# Patient Record
Sex: Male | Born: 1975 | Race: Black or African American | Hispanic: No | Marital: Married
Health system: Southern US, Community
[De-identification: ages and names within clinical notes are randomized; demographics above are authoritative.]

## PROBLEM LIST (undated history)

## (undated) DIAGNOSIS — I10 Essential (primary) hypertension: Secondary | ICD-10-CM

---

## 2017-06-04 ENCOUNTER — Other Ambulatory Visit: Payer: Self-pay

## 2017-06-04 ENCOUNTER — Encounter (HOSPITAL_COMMUNITY): Payer: Self-pay | Admitting: Emergency Medicine

## 2017-06-04 ENCOUNTER — Emergency Department (HOSPITAL_COMMUNITY)
Admission: EM | Admit: 2017-06-04 | Discharge: 2017-06-04 | Disposition: A | Discharge status: Home / Self Care | Payer: Self-pay | Attending: Emergency Medicine | Admitting: Emergency Medicine

## 2017-06-04 DIAGNOSIS — R300 Dysuria: Secondary | ICD-10-CM | POA: Insufficient documentation

## 2017-06-04 DIAGNOSIS — Z711 Person with feared health complaint in whom no diagnosis is made: Secondary | ICD-10-CM

## 2017-06-04 DIAGNOSIS — Z202 Contact with and (suspected) exposure to infections with a predominantly sexual mode of transmission: Secondary | ICD-10-CM | POA: Insufficient documentation

## 2017-06-04 DIAGNOSIS — I10 Essential (primary) hypertension: Secondary | ICD-10-CM | POA: Insufficient documentation

## 2017-06-04 HISTORY — DX: Essential (primary) hypertension: I10

## 2017-06-04 LAB — URINALYSIS, ROUTINE W REFLEX MICROSCOPIC
Bilirubin Urine: NEGATIVE
Glucose, UA: NEGATIVE mg/dL
Hgb urine dipstick: NEGATIVE
Ketones, ur: 20 mg/dL — AB
Leukocytes, UA: NEGATIVE
NITRITE: NEGATIVE
PH: 6 (ref 5.0–8.0)
Protein, ur: NEGATIVE mg/dL
SPECIFIC GRAVITY, URINE: 1.024 (ref 1.005–1.030)

## 2017-06-04 MED ORDER — LIDOCAINE HCL 1 % IJ SOLN
INTRAMUSCULAR | Status: AC
  Administered 2017-06-04: 0.9 mL
  Filled 2017-06-04: qty 20

## 2017-06-04 MED ORDER — METRONIDAZOLE 500 MG PO TABS
2000.0000 mg | ORAL_TABLET | Freq: Once | ORAL | Status: AC
  Administered 2017-06-04: 2000 mg via ORAL
  Filled 2017-06-04: qty 4

## 2017-06-04 MED ORDER — AZITHROMYCIN 250 MG PO TABS
1000.0000 mg | ORAL_TABLET | Freq: Once | ORAL | Status: AC
  Administered 2017-06-04: 1000 mg via ORAL
  Filled 2017-06-04: qty 4

## 2017-06-04 MED ORDER — CEFTRIAXONE SODIUM 250 MG IJ SOLR
250.0000 mg | Freq: Once | INTRAMUSCULAR | Status: AC
  Administered 2017-06-04: 250 mg via INTRAMUSCULAR
  Filled 2017-06-04: qty 250

## 2017-06-04 NOTE — ED Notes (Signed)
Bed: WTR5 Expected date:  Expected time:  Means of arrival:  Comments: 

## 2017-06-04 NOTE — Discharge Instructions (Addendum)
We saw her in the ER for STD screening.  We have given you medications that should cover you for the common infections. We have also sent screening test for HIV.  If the results are positive our hospital will call you.

## 2017-06-04 NOTE — ED Provider Notes (Signed)
[facility]-EMERGENCY DEPT Provider Note   CSN: [id number] Arrival date & time: 06/04/17  1037     History   Chief Complaint Chief Complaint  Patient presents with  . Exposure to STD    HPI Antonio Raymond is a 42 y.o. male.  HPI  42 year old comes in with chief complaint of STD exposure.  Patient has history of hypertension.  Patient states that over the past week he has been having burning with urination.  Patient has not seen any lesions or rash around his penis or scrotum.  Patient has had chlamydia in the past.  He thinks that his wife might have had intercourse with another partner, and spread STDs to him.  Past Medical History:  Diagnosis Date  . Hypertension     There are no active problems to display for this patient.   History reviewed. No pertinent surgical history.      Home Medications    Prior to Admission medications   Not on File    Family History No family history on file.  Social History Social History   Tobacco Use  . Smoking status: Not on file  Substance Use Topics  . Alcohol use: Not on file  . Drug use: Not on file     Allergies   Patient has no known allergies.   Review of Systems Review of Systems  Constitutional: Negative for activity change.  Genitourinary: Positive for dysuria.  Skin: Negative for rash.  Allergic/Immunologic: Negative for immunocompromised state.     Physical Exam Updated Vital Signs BP (!) 185/141 (BP Location: Right Arm) Comment: out of meds for past month  Pulse 75   Temp 98.3 F (36.8 C) (Oral)   Resp 16   SpO2 100%   Physical Exam  Constitutional: He is oriented to person, place, and time. He appears well-developed.  HENT:  Head: Atraumatic.  Neck: Neck supple.  Cardiovascular: Normal rate.  Pulmonary/Chest: Effort normal.  Genitourinary: Penis normal.  Genitourinary Comments: Grossly there is no rash over the scrotum or the penis.  Patient does not have any  discharge that he can appreciate. Negative on inguinal lymphadenopathy.  Neurological: He is alert and oriented to person, place, and time.  Skin: Skin is warm.  Nursing note and vitals reviewed.    ED Treatments / Results  Labs (all labs ordered are listed, but only abnormal results are displayed) Labs Reviewed  URINALYSIS, ROUTINE W REFLEX MICROSCOPIC - Abnormal; Notable for the following components:      Result Value   Ketones, ur 20 (*)    All other components within normal limits  HIV ANTIBODY (ROUTINE TESTING)  GC/CHLAMYDIA PROBE AMP ([place]) NOT AT [facility]    EKG None  Radiology No results found.  Procedures Procedures (including critical care time)  Medications Ordered in ED Medications  azithromycin (ZITHROMAX) tablet 1,000 mg (1,000 mg Oral Given 06/04/17 1143)  cefTRIAXone (ROCEPHIN) injection 250 mg (250 mg Intramuscular Given 06/04/17 1143)  metroNIDAZOLE (FLAGYL) tablet 2,000 mg (2,000 mg Oral Given 06/04/17 1142)  lidocaine (XYLOCAINE) 1 % (with pres) injection (0.9 mLs  Given 06/04/17 1143)     Initial Impression / Assessment and Plan / ED Course  I have reviewed the triage vital signs and the nursing notes.  Pertinent labs & imaging results that were available during my care of the patient were reviewed by me and considered in my medical decision making (see chart for details).     Patient comes in with chief  complaint of dysuria.  Patient's wife had admitted to him to having STD-like symptoms, and having intercourse with another partner.  However exam does not reveal any specific evidence of infection.  UA was ordered and does not show any signs of UTI.  We will cover patient for GC and chlamydia and patient has agreed to be screened for HIV.  Final Clinical Impressions(s) / ED Diagnoses   Final diagnoses:  Concern about STD in male without diagnosis  Dysuria    ED Discharge Orders    None       Derwood Kaplan, MD 06/04/17 1211

## 2017-06-04 NOTE — ED Triage Notes (Signed)
Pt verbalizes exposure to STD; denies symptoms.

## 2017-06-05 LAB — HIV ANTIBODY (ROUTINE TESTING W REFLEX): HIV SCREEN 4TH GENERATION: NONREACTIVE

## 2017-06-05 LAB — GC/CHLAMYDIA PROBE AMP (~~LOC~~) NOT AT ARMC
Chlamydia: NEGATIVE
NEISSERIA GONORRHEA: NEGATIVE

## 2019-10-14 ENCOUNTER — Emergency Department (HOSPITAL_COMMUNITY): Payer: BC Managed Care – PPO

## 2019-10-14 ENCOUNTER — Emergency Department (HOSPITAL_COMMUNITY)
Admission: EM | Admit: 2019-10-14 | Discharge: 2019-10-14 | Disposition: A | Discharge status: Home / Self Care | Payer: BC Managed Care – PPO | Attending: Emergency Medicine | Admitting: Emergency Medicine

## 2019-10-14 DIAGNOSIS — Z79899 Other long term (current) drug therapy: Secondary | ICD-10-CM | POA: Insufficient documentation

## 2019-10-14 DIAGNOSIS — R519 Headache, unspecified: Secondary | ICD-10-CM | POA: Diagnosis not present

## 2019-10-14 DIAGNOSIS — I10 Essential (primary) hypertension: Secondary | ICD-10-CM | POA: Diagnosis not present

## 2019-10-14 DIAGNOSIS — R61 Generalized hyperhidrosis: Secondary | ICD-10-CM | POA: Diagnosis not present

## 2019-10-14 DIAGNOSIS — R1110 Vomiting, unspecified: Secondary | ICD-10-CM | POA: Diagnosis not present

## 2019-10-14 DIAGNOSIS — R55 Syncope and collapse: Secondary | ICD-10-CM | POA: Insufficient documentation

## 2019-10-14 DIAGNOSIS — E669 Obesity, unspecified: Secondary | ICD-10-CM | POA: Insufficient documentation

## 2019-10-14 DIAGNOSIS — R42 Dizziness and giddiness: Secondary | ICD-10-CM | POA: Diagnosis not present

## 2019-10-14 LAB — CBC WITH DIFFERENTIAL/PLATELET
Abs Immature Granulocytes: 0.01 10*3/uL (ref 0.00–0.07)
Basophils Absolute: 0 10*3/uL (ref 0.0–0.1)
Basophils Relative: 0 %
Eosinophils Absolute: 0.1 10*3/uL (ref 0.0–0.5)
Eosinophils Relative: 1 %
HCT: 43.1 % (ref 39.0–52.0)
Hemoglobin: 13.9 g/dL (ref 13.0–17.0)
Immature Granulocytes: 0 %
Lymphocytes Relative: 33 %
Lymphs Abs: 1.9 10*3/uL (ref 0.7–4.0)
MCH: 30 pg (ref 26.0–34.0)
MCHC: 32.3 g/dL (ref 30.0–36.0)
MCV: 93.1 fL (ref 80.0–100.0)
Monocytes Absolute: 0.3 10*3/uL (ref 0.1–1.0)
Monocytes Relative: 5 %
Neutro Abs: 3.5 10*3/uL (ref 1.7–7.7)
Neutrophils Relative %: 61 %
Platelets: 226 10*3/uL (ref 150–400)
RBC: 4.63 MIL/uL (ref 4.22–5.81)
RDW: 12.7 % (ref 11.5–15.5)
WBC: 5.8 10*3/uL (ref 4.0–10.5)
nRBC: 0 % (ref 0.0–0.2)

## 2019-10-14 LAB — BASIC METABOLIC PANEL
Anion gap: 10 (ref 5–15)
BUN: 19 mg/dL (ref 6–20)
CO2: 22 mmol/L (ref 22–32)
Calcium: 8.8 mg/dL — ABNORMAL LOW (ref 8.9–10.3)
Chloride: 105 mmol/L (ref 98–111)
Creatinine, Ser: 1.15 mg/dL (ref 0.61–1.24)
GFR calc Af Amer: >60 mL/min (ref >60)
GFR calc non Af Amer: >60 mL/min (ref >60)
Glucose, Bld: 174 mg/dL — ABNORMAL HIGH (ref 70–99)
Potassium: 3.8 mmol/L (ref 3.5–5.1)
Sodium: 137 mmol/L (ref 135–145)

## 2019-10-14 LAB — TROPONIN I (HIGH SENSITIVITY)
Troponin I (High Sensitivity): 6 ng/L (ref <18)
Troponin I (High Sensitivity): 6 ng/L (ref <18)

## 2019-10-14 MED ORDER — SODIUM CHLORIDE 0.9 % IV BOLUS
1000.0000 mL | Freq: Once | INTRAVENOUS | Status: AC
  Administered 2019-10-14: 1000 mL via INTRAVENOUS

## 2019-10-14 MED ORDER — PROCHLORPERAZINE EDISYLATE 10 MG/2ML IJ SOLN
10.0000 mg | Freq: Once | INTRAMUSCULAR | Status: AC
  Administered 2019-10-14: 10 mg via INTRAVENOUS
  Filled 2019-10-14: qty 2

## 2019-10-14 MED ORDER — MECLIZINE HCL 25 MG PO TABS: 25.0000 mg | ORAL_TABLET | Freq: Three times a day (TID) | ORAL | 0 refills | Status: AC | PRN

## 2019-10-14 MED ORDER — ACETAMINOPHEN 500 MG PO TABS
1000.0000 mg | ORAL_TABLET | Freq: Once | ORAL | Status: AC
  Administered 2019-10-14: 1000 mg via ORAL
  Filled 2019-10-14: qty 2

## 2019-10-14 MED ORDER — DIPHENHYDRAMINE HCL 50 MG/ML IJ SOLN
50.0000 mg | Freq: Once | INTRAMUSCULAR | Status: AC
  Administered 2019-10-14: 50 mg via INTRAVENOUS
  Filled 2019-10-14: qty 1

## 2019-10-14 MED ORDER — ONDANSETRON HCL 4 MG/2ML IJ SOLN
4.0000 mg | Freq: Once | INTRAMUSCULAR | Status: AC
  Administered 2019-10-14: 4 mg via INTRAVENOUS
  Filled 2019-10-14: qty 2

## 2019-10-14 NOTE — ED Triage Notes (Signed)
Pt arrives with [facility] from work c/o 8/10 headache above right eye. Pt reports feeling dizzy and weak x2 days. Pt den\ies any CP; per EMS, pt was diaphoretic and vomited upon arrival. Pt a&ox4  EMS vitals:  Manual BP 220/130 (manual) 160's SBP (machine)

## 2019-10-14 NOTE — ED Notes (Signed)
Patient transported to CT 

## 2019-10-14 NOTE — ED Provider Notes (Signed)
[facility] EMERGENCY DEPARTMENT Provider Note   CSN: [id number] Arrival date & time: 10/14/19  1120     History Chief Complaint  Patient presents with  . Dizziness  . Headache    Antonio Raymond is a 44 y.o. male with PMHx HTN who presents to the ED today via EMS with complaint of sudden onset, constant, severe, right sided headache that began approximately 1 hour ago. Per EMS pt has been having room spinning dizziness/feeling off balanced for the past 2 days. He went to work today and was evaluated by the physician on site when he began vomiting which is when EMS was called out. Shortly after the vomiting began he began having a headache; he reports he typically gets headaches when his blood pressure is elevated however states it has never been this severe. BP with EMS manually 220/130. Pt reports he took his BP meds last night and has not missed any doses. Pt denies any unilateral weakness or numbness, speech changes, confusion, facial droop, vision changes, chest pain, shortness of breath, or any other associated symptoms.   The history is provided by the patient, the EMS personnel and medical records.       Past Medical History:  Diagnosis Date  . Hypertension     There are no problems to display for this patient.   No past surgical history on file.     No family history on file.  Social History   Tobacco Use  . Smoking status: Not on file  Substance Use Topics  . Alcohol use: Not on file  . Drug use: Not on file    Home Medications Prior to Admission medications   Medication Sig Start Date End Date Taking? Authorizing Provider  atorvastatin (LIPITOR) 10 MG tablet Take 10 mg by mouth at bedtime. 08/18/19  Yes [provider]  citalopram (CELEXA) 20 MG tablet Take 20 mg by mouth daily. 09/23/19  Yes [provider]  losartan-hydrochlorothiazide (HYZAAR) 100-25 MG tablet Take 1 tablet by mouth at bedtime. 09/29/19  Yes [provider]  terbinafine (LAMISIL) 250 MG tablet Take 250 mg by mouth at bedtime. 09/23/19  Yes [provider]  meclizine (ANTIVERT) 25 MG tablet Take 1 tablet (25 mg total) by mouth 3 (three) times daily as needed for dizziness. 10/14/19   Tanda Rockers, PA-C    Allergies    Patient has no known allergies.  Review of Systems   Review of Systems  Constitutional: Positive for diaphoresis. Negative for chills and fever.  Eyes: Negative for visual disturbance.  Respiratory: Negative for cough.   Cardiovascular: Negative for chest pain.  Gastrointestinal: Positive for vomiting.  Neurological: Positive for dizziness and headaches. Negative for weakness and numbness.  All other systems reviewed and are negative.   Physical Exam Updated Vital Signs BP 123/85   Pulse 69   Temp 97.8 F (36.6 C) (Oral)   Resp 13   Ht 6' 5.5" (1.969 m)   Wt (!) 147.4 kg   SpO2 97%   BMI 38.04 kg/m   Physical Exam Vitals and nursing note reviewed.  Constitutional:      Appearance: He is obese. He is not ill-appearing or diaphoretic.  HENT:     Head: Normocephalic and atraumatic.  Eyes:     Conjunctiva/sclera: Conjunctivae normal.  Cardiovascular:     Rate and Rhythm: Normal rate and regular rhythm.     Heart sounds: Normal heart sounds.  Pulmonary:     Effort: Pulmonary  effort is normal.     Breath sounds: Normal breath sounds. No wheezing, rhonchi or rales.  Abdominal:     Palpations: Abdomen is soft.     Tenderness: There is no abdominal tenderness.  Musculoskeletal:     Cervical back: Normal range of motion and neck supple.  Skin:    General: Skin is warm and dry.  Neurological:     Mental Status: He is alert.     Comments: CN 3-12 grossly intact A&O x4 GCS 15 Sensation and strength intact to BUE and BLEs Coordination with finger-to-nose WNL Neg romberg, neg pronator drift     ED Results / Procedures / Treatments   Labs (all labs ordered are listed, but only  abnormal results are displayed) Labs Reviewed  BASIC METABOLIC PANEL - Abnormal; Notable for the following components:      Result Value   Glucose, Bld 174 (*)    Calcium 8.8 (*)    All other components within normal limits  CBC WITH DIFFERENTIAL/PLATELET  URINALYSIS, ROUTINE W REFLEX MICROSCOPIC  TROPONIN I (HIGH SENSITIVITY)  TROPONIN I (HIGH SENSITIVITY)    EKG None  Radiology CT Head Wo Contrast  Result Date: 10/14/2019 CLINICAL DATA:  Increasing headache. EXAM: CT HEAD WITHOUT CONTRAST TECHNIQUE: Contiguous axial images were obtained from the base of the skull through the vertex without intravenous contrast. COMPARISON:  None. FINDINGS: Brain: No evidence of acute infarction, hemorrhage, hydrocephalus, extra-axial collection or mass lesion/mass effect. Vascular: No hyperdense vessel or unexpected calcification. Skull: Intact.  No focal lesion. Sinuses/Orbits: Negative. Other: None. IMPRESSION: Negative head CT. Electronically Signed   By: Drusilla Kanner M.D.   On: 10/14/2019 12:05   MR ANGIO HEAD WO CONTRAST  Result Date: 10/14/2019 CLINICAL DATA:  Dizziness, nonspecific. Additional history provided: Stroke follow-up. EXAM: MRI HEAD WITHOUT CONTRAST MRA HEAD WITHOUT CONTRAST TECHNIQUE: Multiplanar, multiecho pulse sequences of the brain and surrounding structures were obtained without intravenous contrast. Angiographic images of the head were obtained using MRA technique without contrast. COMPARISON:  Head CT 10/14/2019 FINDINGS: MRI HEAD FINDINGS Brain: The examination is intermittently motion degraded. Most notably, there is mild-to-moderate motion degradation of the axial T2/FLAIR sequence, moderate motion degradation of the sagittal T1 weighted sequence and moderate motion degradation of the coronal T2 weighted sequence. Cerebral volume is normal for age. No focal parenchymal signal abnormality is identified. There is no acute infarct. No evidence of intracranial mass. No chronic  intracranial blood products. No extra-axial fluid collection. No midline shift. Vascular: Reported below. Skull and upper cervical spine: No focal marrow lesion Sinuses/Orbits: Visualized orbits show no acute finding. Mild ethmoid sinus mucosal thickening. Tiny left maxillary sinus mucous retention cyst. No significant mastoid effusion. MRA HEAD FINDINGS The examination is mild to moderately motion degraded, limiting evaluation. The intracranial internal carotid arteries are patent. The M1 middle cerebral arteries are patent without significant stenosis. No M2 proximal branch occlusion or high-grade proximal stenosis is identified. Partially azygos configuration of the anterior cerebral arteries. The anterior cerebral arteries are patent. Irregularity of the bilateral vertebral arteries with apparent sites of moderate/severe stenosis within both vessels. However, these stenoses could be accentuated by motion artifact on the current exam. The basilar artery is patent. The posterior cerebral arteries are patent. Hypoplastic P1 left posterior cerebral artery. Moderate stenosis within proximal P2 left PCA. No intracranial aneurysm is identified. IMPRESSION: MRI brain: 1. Motion degraded examination as described. 2. No evidence of acute intracranial abnormality, including acute infarction. 3. Mild ethmoid sinus mucosal thickening. 4.  Tiny left maxillary sinus mucous retention cyst. MRA head: 1. Mild to moderately motion degraded examination, limiting evaluation for stenoses and small aneurysms. 2. No intracranial large vessel occlusion. 3. Irregularity of the intracranial vertebral arteries with sites of apparent moderate/severe stenosis within both vessels. However, the stenoses could be accentuated by motion artifact on the current exam. 4. Moderate stenosis within the proximal P2 right PCA. 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 Dizziness, nonspecific, stroke, follow-up. EXAM: MRA NECK WITHOUT CONTRAST TECHNIQUE: Angiographic images of the neck were obtained using MRA technique without intravenous contrast. Carotid stenosis measurements (when applicable) are obtained utilizing NASCET criteria, using the distal internal carotid diameter as the denominator. COMPARISON:  No pertinent prior exams are available for comparison. FINDINGS: The examination is markedly limited due to motion degradation and non-contrast technique. This precludes adequate evaluation for stenoses within the carotid and vertebral arteries within the neck. This also precludes adequate evaluation for dissection or aneurysm. The carotid and vertebral arteries are patent within the neck bilaterally with antegrade flow. IMPRESSION: Examination markedly limited due to motion degradation and non-contrast technique. This precludes adequate evaluation for stenoses within the carotid and vertebral arteries within the neck. This also precludes adequate evaluation for dissection or aneurysm. The carotid and vertebral arteries are patent within the neck bilaterally with antegrade flow. Electronically Signed   By: Jackey Loge DO   On: 10/14/2019 16:00   MR BRAIN WO CONTRAST  Result Date: 10/14/2019 CLINICAL DATA:  Dizziness, nonspecific. Additional history provided: Stroke follow-up. EXAM: MRI HEAD WITHOUT CONTRAST MRA HEAD WITHOUT CONTRAST TECHNIQUE: Multiplanar, multiecho pulse sequences of the brain and surrounding structures were obtained without intravenous contrast. Angiographic images of the head were obtained using MRA technique without contrast. COMPARISON:  Head CT 10/14/2019 FINDINGS: MRI HEAD FINDINGS Brain: The examination is intermittently motion degraded. Most notably, there is mild-to-moderate motion degradation of the axial T2/FLAIR sequence, moderate motion  degradation of the sagittal T1 weighted sequence and moderate motion degradation of the coronal T2 weighted sequence. Cerebral volume is normal for age. No focal parenchymal signal abnormality is identified. There is no acute infarct. No evidence of intracranial mass. No chronic intracranial blood products. No extra-axial fluid collection. No midline shift. Vascular: Reported below. Skull and upper cervical spine: No focal marrow lesion Sinuses/Orbits: Visualized orbits show no acute finding. Mild ethmoid sinus mucosal thickening. Tiny left maxillary sinus mucous retention cyst. No significant mastoid effusion. MRA HEAD FINDINGS The examination is mild to moderately motion degraded, limiting evaluation. The intracranial internal carotid arteries are patent. The M1 middle cerebral arteries are patent without significant stenosis. No M2 proximal branch occlusion or high-grade proximal stenosis is identified. Partially azygos configuration of the anterior cerebral arteries. The anterior cerebral arteries are patent. Irregularity of the bilateral vertebral arteries with apparent sites of moderate/severe stenosis within both vessels. However, these stenoses could be accentuated by motion artifact on the current exam. The basilar artery is patent. The posterior cerebral arteries are patent. Hypoplastic P1 left posterior cerebral artery. Moderate stenosis within proximal P2 left PCA. No intracranial aneurysm is identified. IMPRESSION: MRI brain: 1. Motion degraded examination as described. 2. No evidence of acute intracranial abnormality, including acute infarction. 3. Mild ethmoid sinus mucosal thickening. 4. Tiny left maxillary sinus mucous retention cyst. MRA head: 1. Mild to moderately motion degraded  examination, limiting evaluation for stenoses and small aneurysms. 2. No intracranial large vessel occlusion. 3. Irregularity of the intracranial vertebral arteries with sites of apparent moderate/severe stenosis within  both vessels. However, the stenoses could be accentuated by motion artifact on the current exam. 4. Moderate stenosis within the proximal P2 right PCA. Electronically Signed   By: Jackey LogeKyle  Golden DO   On: 10/14/2019 15:56   DG Chest Port 1 View  Result Date: 10/14/2019 CLINICAL DATA:  44 year old male with history of severe dizziness for the past 2 days. Shortness of breath. EXAM: PORTABLE CHEST 1 VIEW COMPARISON:  No priors. FINDINGS: Lung volumes are normal. No consolidative airspace disease. No pleural effusions. No pneumothorax. No pulmonary nodule or mass noted. Pulmonary vasculature and the cardiomediastinal silhouette are within normal limits. IMPRESSION: No radiographic evidence of acute cardiopulmonary disease. Electronically Signed   By: Trudie Reedaniel  Entrikin M.D.   On: 10/14/2019 11:48    Procedures Procedures (including critical care time)  Medications Ordered in ED Medications  ondansetron (ZOFRAN) injection 4 mg (4 mg Intravenous Given 10/14/19 1224)  prochlorperazine (COMPAZINE) injection 10 mg (10 mg Intravenous Given 10/14/19 1316)  diphenhydrAMINE (BENADRYL) injection 50 mg (50 mg Intravenous Given 10/14/19 1317)  acetaminophen (TYLENOL) tablet 1,000 mg (1,000 mg Oral Given 10/14/19 1314)  sodium chloride 0.9 % bolus 1,000 mL (0 mLs Intravenous Stopped 10/14/19 1420)    ED Course  I have reviewed the triage vital signs and the nursing notes.  Pertinent labs & imaging results that were available during my care of the patient were reviewed by me and considered in my medical decision making (see chart for details).    MDM Rules/Calculators/A&P                          44 year old male who presents to the ED today via EMS for sudden onset headache that began this morning.  Has been having her spinning dizziness for the past 2 days and began having spontaneous vomiting while at work today with sudden onset headache.  Per EMS patient was diaphoretic on arrival.  Blood pressure elevated at  220/130 manually.  Patient with history of hypertension, states he took his medication last night has not missed any doses.  My assessment patient without any focal neuro deficits.  He continues to complain of a headache.  We will plan for stat CT head if there is some concern for possible ICH elevated blood pressure and sudden onset headache with vomiting.  Given patient's complaint of room spinning dizziness as well as feeling off balance there is some concern for possible vertiginous stroke as well.  We will plan to reassess once CT head returns however patient may benefit from MRI.  Patient's blood pressure in the ED today 136/100.  We will continue to monitor however this does seem improved from EMSs manual read.  Will work-up for ACS as well as diaphoresis and vomiting however patient denies any chest pain or shortness of breath.  No history of CAD.  No family history.  Patient is a non-smoker.   CXR clear CT Head without signs of bleed at this time. No other findings today. Have attempted to ambulate patient however he is very off balanced and can only tolerate a few steps before he states he needs to sit back down. Given these findings will work up with MRI brain, MRA head and neck with concern for possible cerebellar stroke vs aneurysm vs dissection. Will plan to provide  HA cocktail at this time as well.   CBC without leukocytosis. Hgb stable at 13.9 BMP with glucose 174. Creatinine 1.15. No other abnormalities.  Troponin of 6; will repeat.   Before pt went to MRI he was found sleeping comfortably with improvement in his HA.   MRI IMPRESSION:  MRI brain:    1. Motion degraded examination as described.  2. No evidence of acute intracranial abnormality, including acute  infarction.  3. Mild ethmoid sinus mucosal thickening.  4. Tiny left maxillary sinus mucous retention cyst.    MRA head:    1. Mild to moderately motion degraded examination, limiting  evaluation for stenoses and  small aneurysms.  2. No intracranial large vessel occlusion.  3. Irregularity of the intracranial vertebral arteries with sites of  apparent moderate/severe stenosis within both vessels. However, the  stenoses could be accentuated by motion artifact on the current  exam.  4. Moderate stenosis within the proximal P2 right PCA.   Repeat troponin of 6.   On reassessment pt reports improvement in his HA. Blood pressure normalized 123.85. Suspect his symptoms related to vertigo with headache. Will plan to dispo home with meclizine. Will have pt follow up with neurology. Will have him follow up with his PCP as well regarding ED visit. Pt is in agreement with plan and stable for discharge home.   This note was prepared using Dragon voice recognition software and may include unintentional dictation errors due to the inherent limitations of voice recognition software.  Final Clinical Impression(s) / ED Diagnoses Final diagnoses:  Vertigo  Bad headache    Rx / DC Orders ED Discharge Orders         Ordered    meclizine (ANTIVERT) 25 MG tablet  3 times daily PRN        10/14/19 1642           Discharge Instructions     Your workup was reassuring today. Your symptoms may be related to vertigo with a headache. I have prescribed medication for you to help with the dizziness.  Please follow up with [facility] regarding your dizziness and headache.  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 Follow up with vascular surgery regarding this. Continue taking your medications as prescribed.   Return to the ED IMMEDIATELY for any worsening symptoms       Tanda Rockers, PA-C 10/14/19 1645    Arby Barrette, MD 10/17/19 [phone]

## 2019-10-14 NOTE — ED Notes (Signed)
Patient verbalizes understanding of discharge instructions. Opportunity for questioning and answers were provided. Armband removed by staff, pt discharged from ED.  

## 2019-10-14 NOTE — ED Notes (Signed)
Pt transported to MRI 

## 2019-10-14 NOTE — Discharge Instructions (Addendum)
Your workup was reassuring today. Your symptoms may be related to vertigo with a headache. I have prescribed medication for you to help with the dizziness.  Please follow up with [facility] regarding your dizziness and headache.  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 Follow up with vascular surgery regarding this. Continue taking your medications as prescribed.   Return to the ED IMMEDIATELY for any worsening symptoms

## 2021-03-19 IMAGING — MR MR MRA HEAD W/O CM
2 series · 23 of 48 positions shown · non-contrast
Comparison: Head CT 10/14/2019

CLINICAL DATA: Dizziness, nonspecific. Additional history provided:
Stroke follow-up.

EXAM:
MRI HEAD WITHOUT CONTRAST
MRA HEAD WITHOUT CONTRAST
TECHNIQUE: Multiplanar, multiecho pulse sequences of the brain and surrounding
structures were obtained without intravenous contrast. Angiographic
images of the head were obtained using MRA technique without
contrast.

[Series 5: 3d cow · axial · 0.5mm · 0.41mm/px · z∈[-109,-11]mm · 22 of 208 slices shown]
[im 1/208]
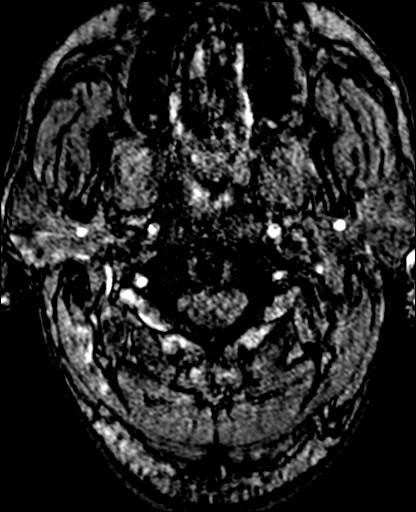
[im 5/208]
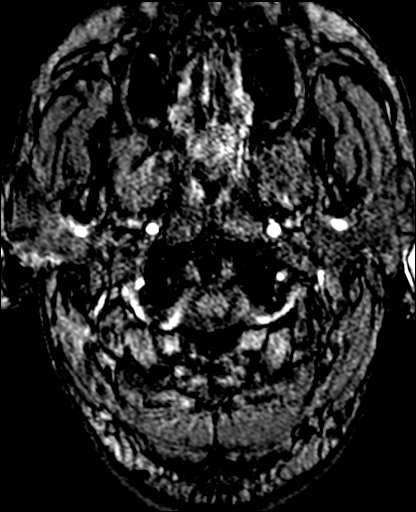
[im 10/208]
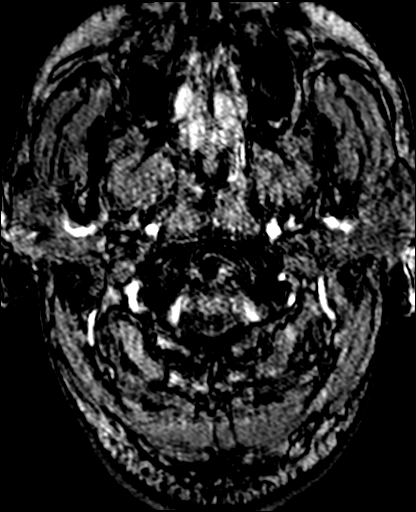
[im 14/208]
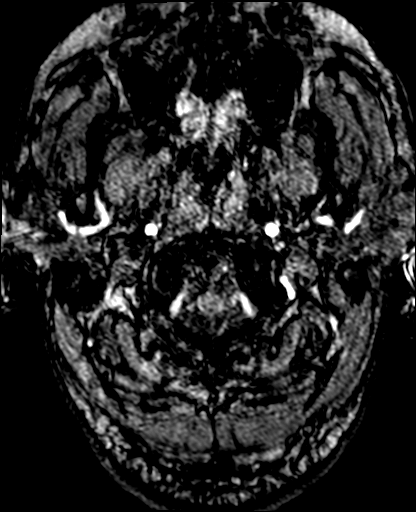
[im 19/208]
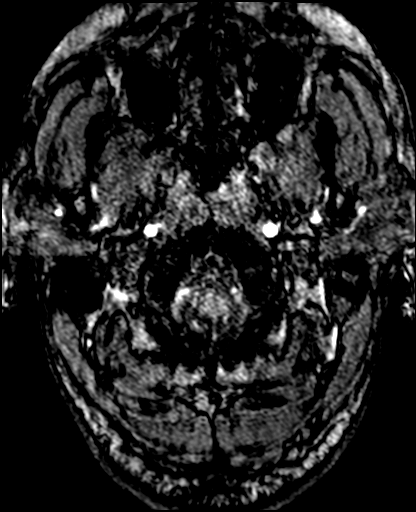
[im 23/208]
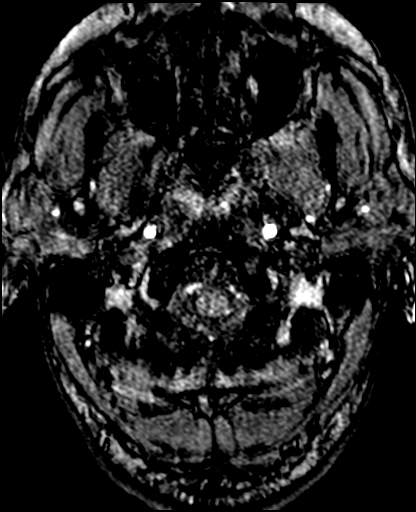
[im 28/208]
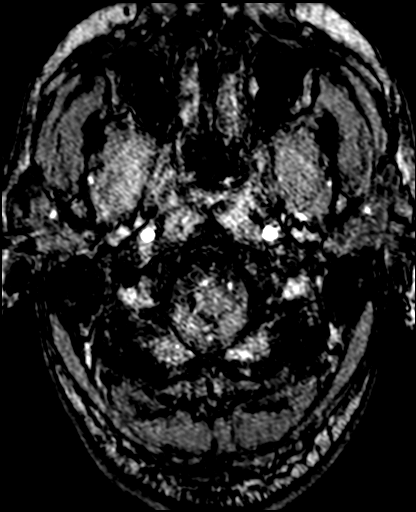
[im 32/208]
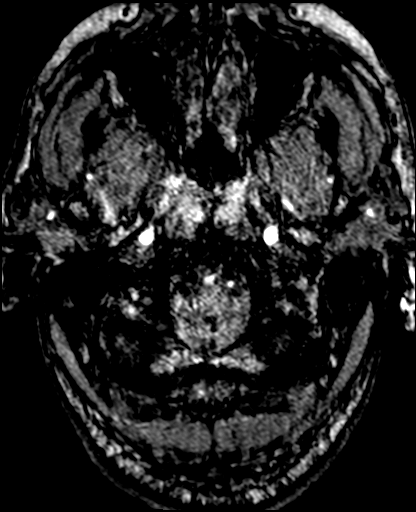
[im 37/208]
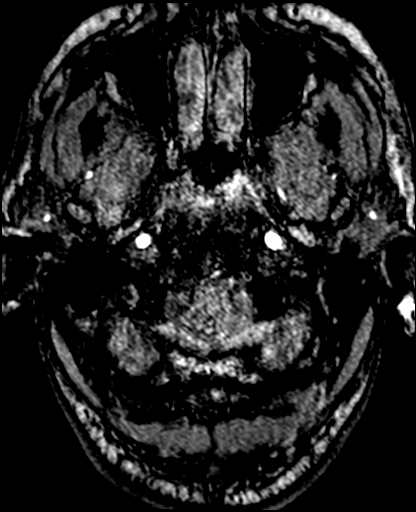
[im 41/208]
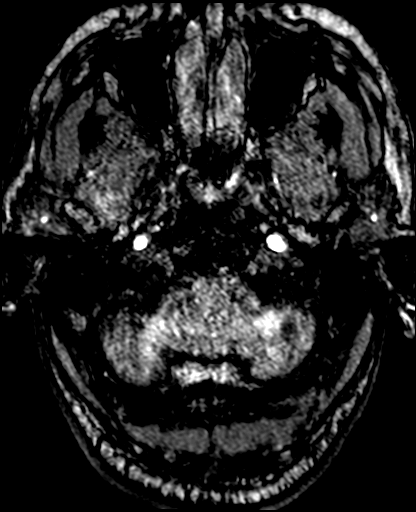
[im 46/208]
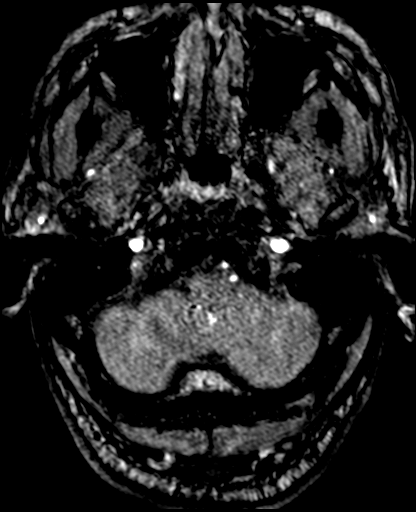
[im 50/208]
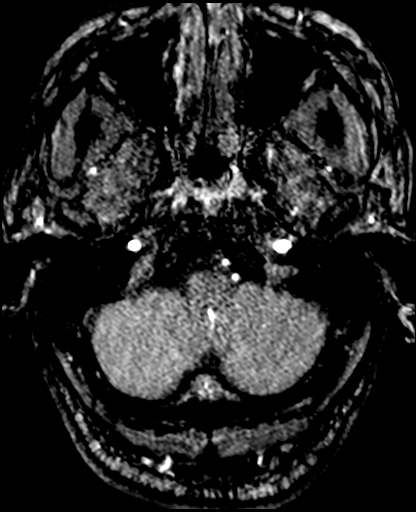
[im 55/208]
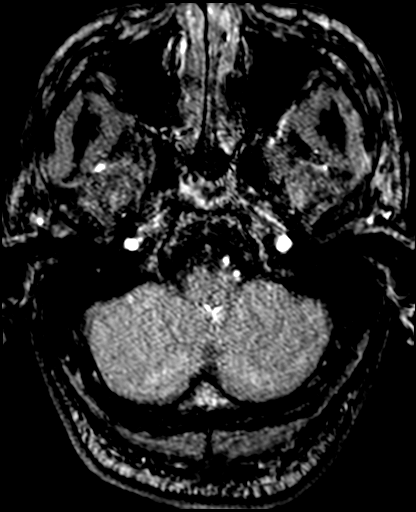
[im 59/208]
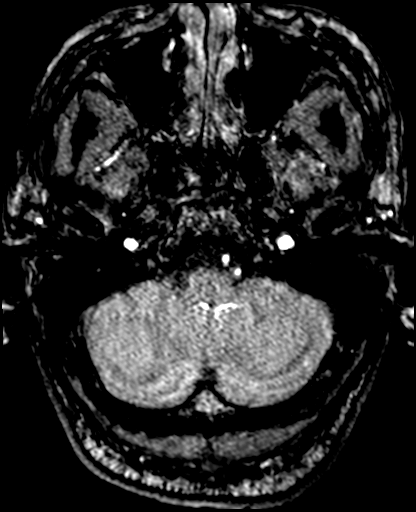
[im 64/208]
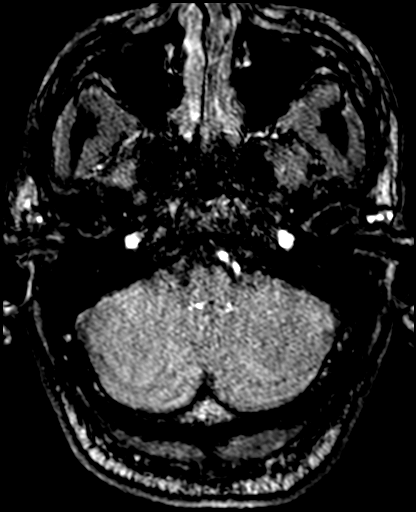
[im 91/208]
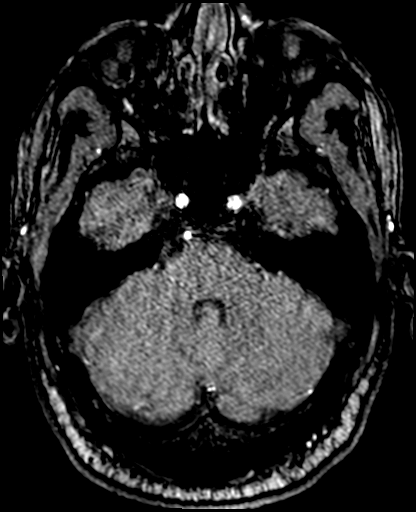
[im 104/208]
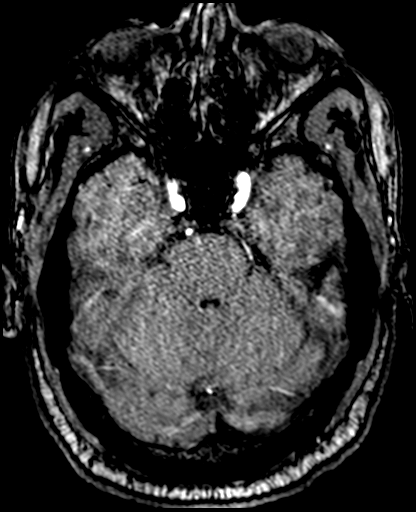
[im 118/208]
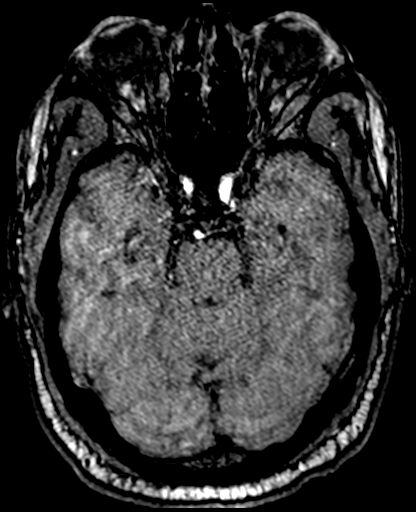
[im 145/208]
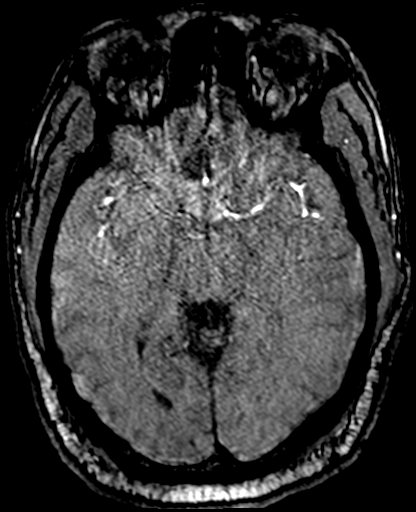
[im 172/208]
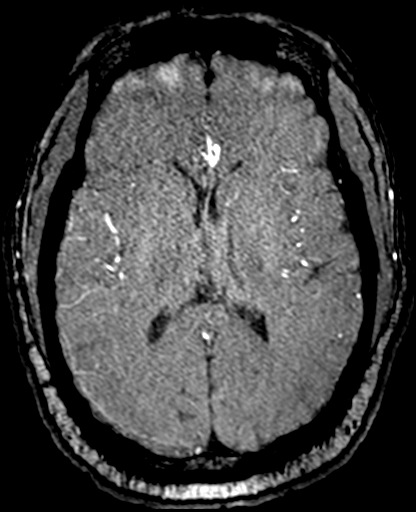
[im 176/208]
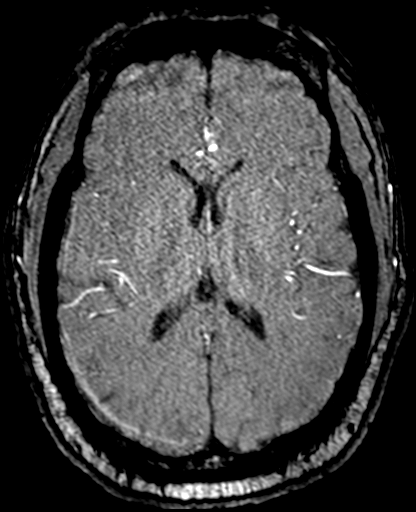
[im 199/208]
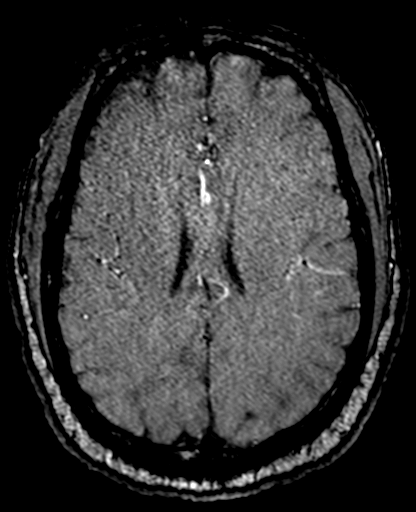

[Series 8: 3d cow_mip_tra · axial · 104.0mm · 0.41mm/px · 1 of 1 slices shown]
[im 1/1]
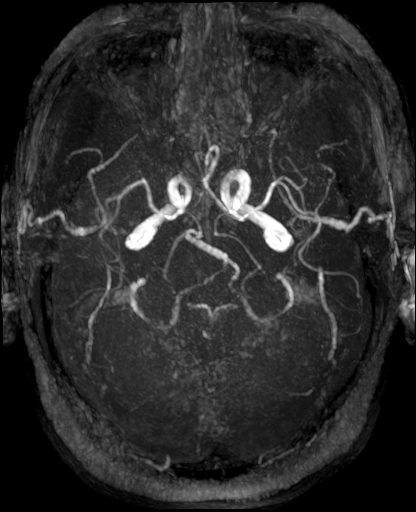

[23 of 48 positions shown; findings below may reference images not displayed]

FINDINGS: MRI HEAD FINDINGS

Brain:

The examination is intermittently motion degraded. Most notably,
there is mild-to-moderate motion degradation of the axial T2/FLAIR
sequence, moderate motion degradation of the sagittal T1 weighted
sequence and moderate motion degradation of the coronal T2 weighted
sequence.

Cerebral volume is normal for age.

No focal parenchymal signal abnormality is identified.

There is no acute infarct.

No evidence of intracranial mass.

No chronic intracranial blood products.

No extra-axial fluid collection.

No midline shift.

Vascular: Reported below.

Skull and upper cervical spine: No focal marrow lesion

Sinuses/Orbits: Visualized orbits show no acute finding. Mild
ethmoid sinus mucosal thickening. Tiny left maxillary sinus mucous
retention cyst. No significant mastoid effusion.

MRA HEAD FINDINGS

The examination is mild to moderately motion degraded, limiting
evaluation.

The intracranial internal carotid arteries are patent. The M1 middle
cerebral arteries are patent without significant stenosis. No M2
proximal branch occlusion or high-grade proximal stenosis is
identified. Partially azygos configuration of the anterior cerebral
arteries. The anterior cerebral arteries are patent.

Irregularity of the bilateral vertebral arteries with apparent sites
of moderate/severe stenosis within both vessels. However, these
stenoses could be accentuated by motion artifact on the current
exam. The basilar artery is patent. The posterior cerebral arteries
are patent. Hypoplastic P1 left posterior cerebral artery. Moderate
stenosis within proximal P2 left PCA.

No intracranial aneurysm is identified.
IMPRESSION: MRI brain:

1. Motion degraded examination as described.
2. No evidence of acute intracranial abnormality, including acute
infarction.
3. Mild ethmoid sinus mucosal thickening.
4. Tiny left maxillary sinus mucous retention cyst.

MRA head:

1. Mild to moderately motion degraded examination, limiting
evaluation for stenoses and small aneurysms.
2. No intracranial large vessel occlusion.
3. Irregularity of the intracranial vertebral arteries with sites of
apparent moderate/severe stenosis within both vessels. However, the
stenoses could be accentuated by motion artifact on the current
exam.
4. Moderate stenosis within the proximal P2 right PCA.

## 2021-03-19 IMAGING — CT CT HEAD W/O CM
3 of 4 series · 13 of 47 positions shown, 15 images · non-contrast
Comparison: None.

CLINICAL DATA: Increasing headache.

EXAM:
CT HEAD WITHOUT CONTRAST
TECHNIQUE: Contiguous axial images were obtained from the base of the skull
through the vertex without intravenous contrast.

[Series 3: head without · axial · non-contrast · 0.44mm/px · z∈[-96,+29]mm · 7 of 35 slices shown, 9 images]
[im 5/35  brain]
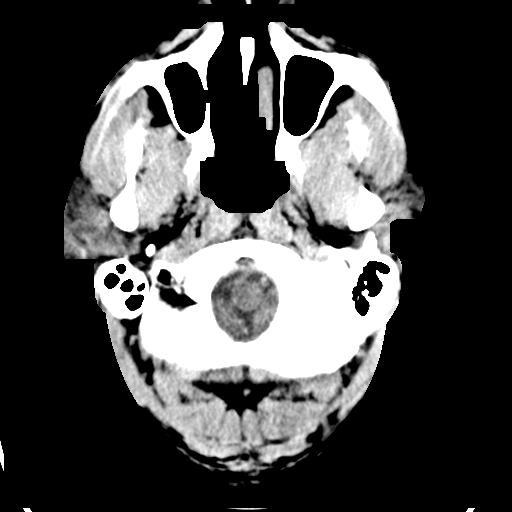
[im 5/35  bone]
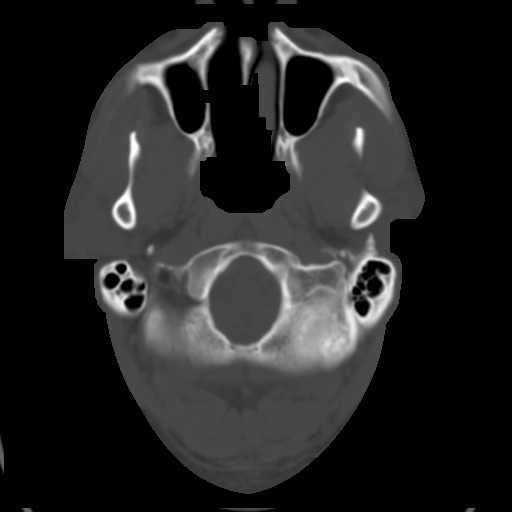
[im 9/35  brain]
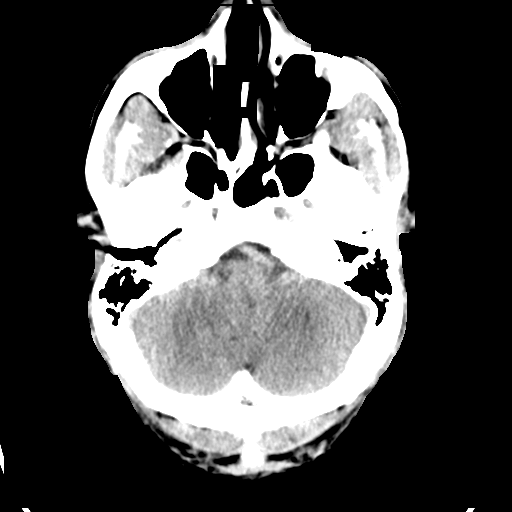
[im 13/35  brain]
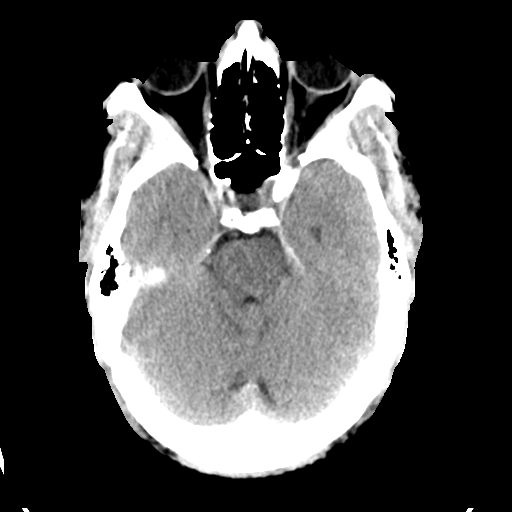
[im 18/35  brain]
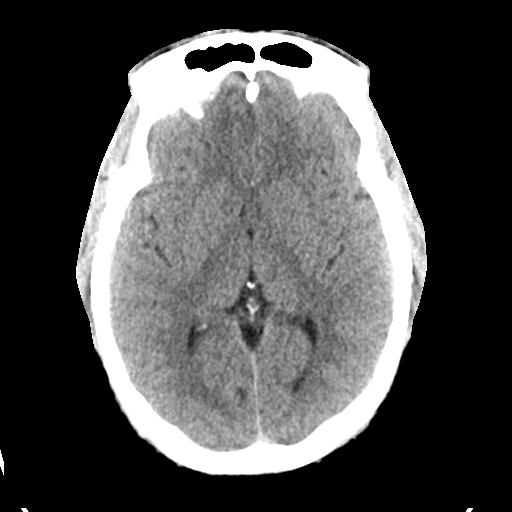
[im 22/35  brain]
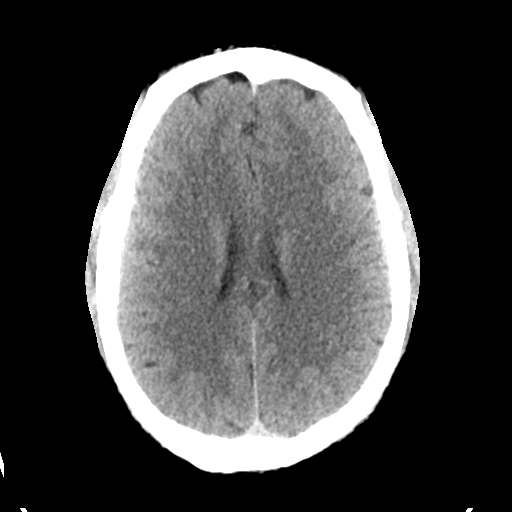
[im 22/35  bone]
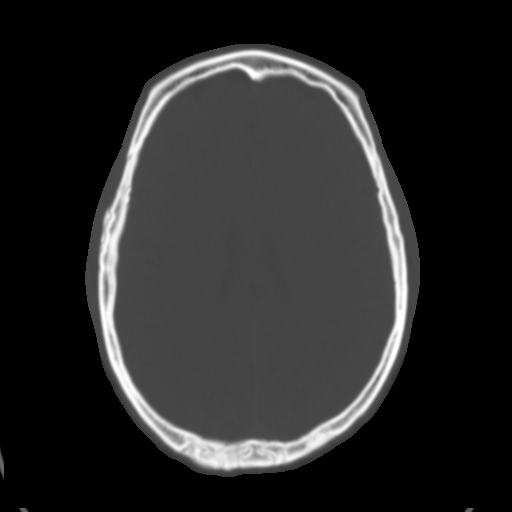
[im 26/35  brain]
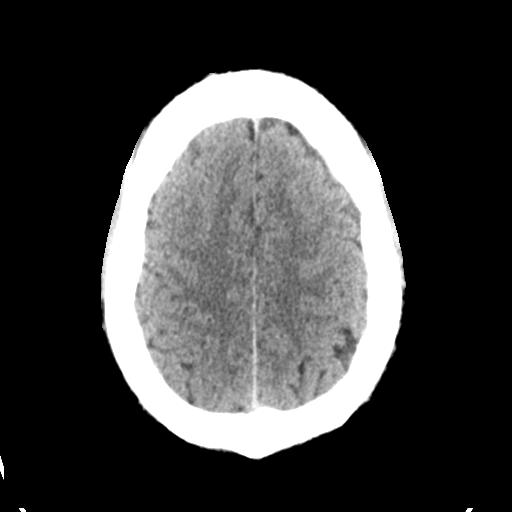
[im 30/35  brain]
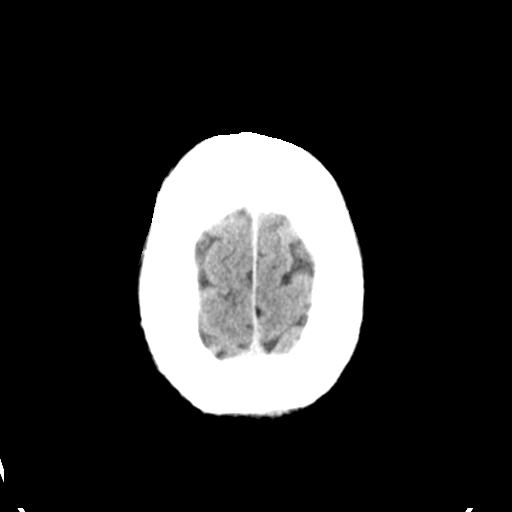

[Series 5: head without cor · coronal · non-contrast · 0.34mm/px · 3 of 74 slices shown]
[im 25/74  brain]
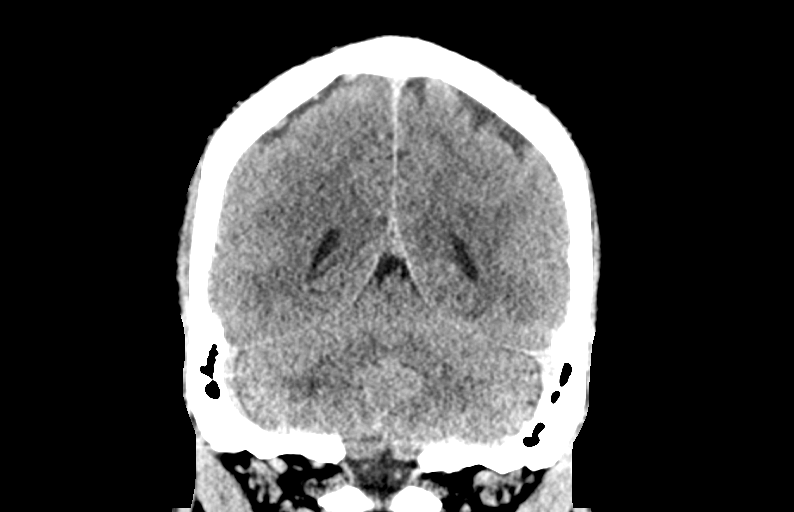
[im 33/74  brain]
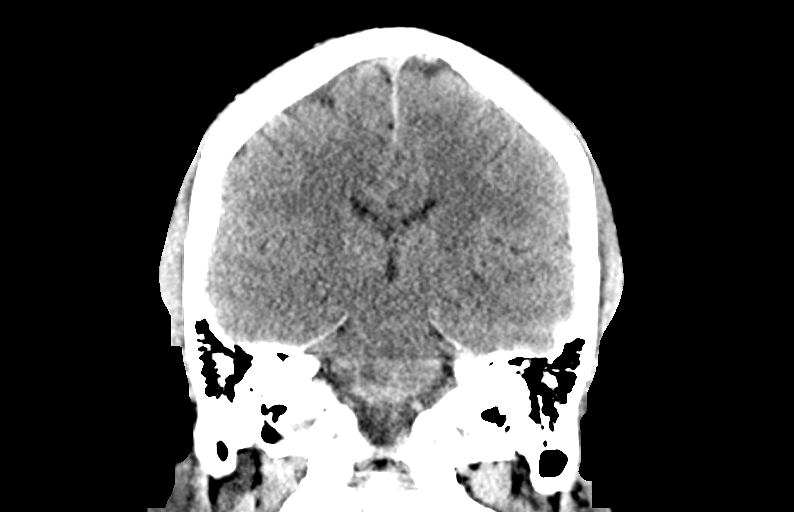
[im 41/74  brain]
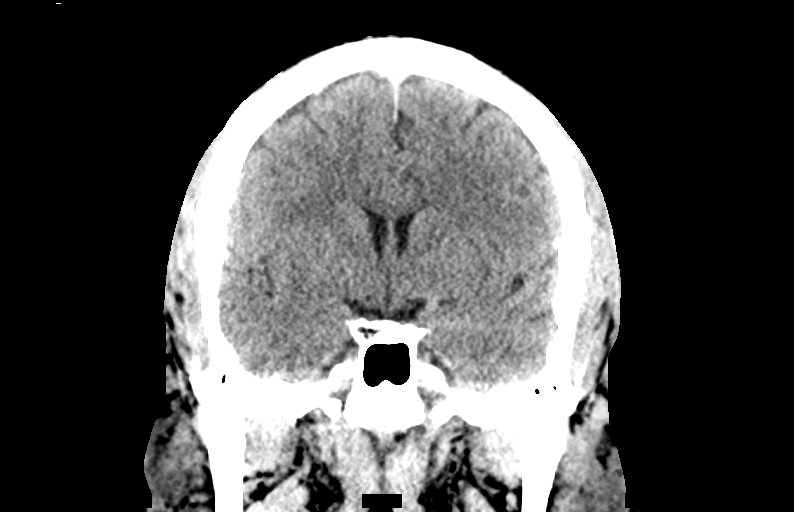

[Series 6: head without sag · sagittal · non-contrast · 0.34mm/px · 3 of 67 slices shown]
[im 23/67  brain]
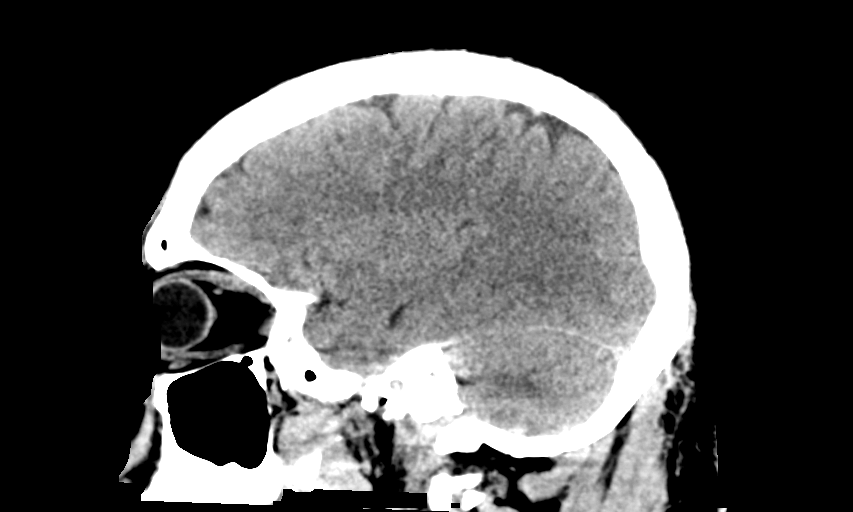
[im 34/67  brain]
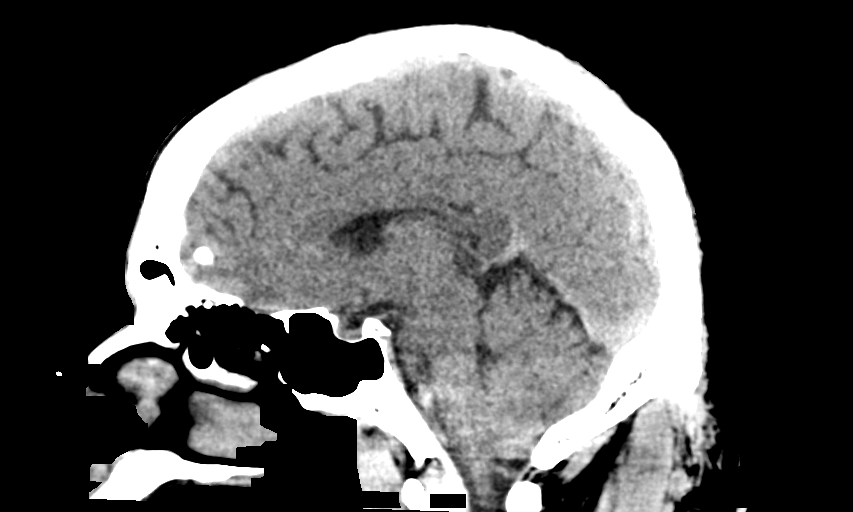
[im 45/67  brain]
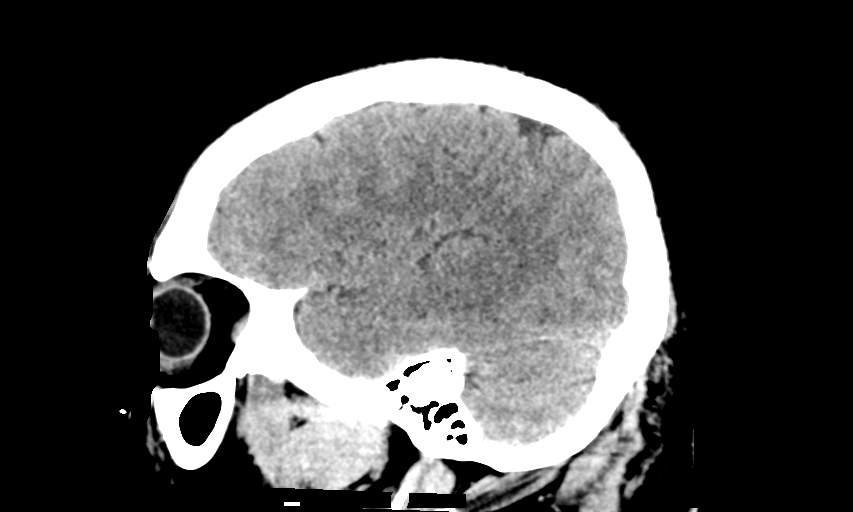

[13 of 47 positions shown; findings below may reference images not displayed]

FINDINGS: Brain: No evidence of acute infarction, hemorrhage, hydrocephalus,
extra-axial collection or mass lesion/mass effect.

Vascular: No hyperdense vessel or unexpected calcification.

Skull: Intact.  No focal lesion.

Sinuses/Orbits: Negative.

Other: None.
IMPRESSION: Negative head CT.
